# Patient Record
Sex: Male | Born: 1985
Health system: Western US, Academic
[De-identification: ages and names within clinical notes are randomized; demographics above are authoritative.]

---

## 2020-07-14 IMAGING — CT CT NECK SOFT TISSUE W CONTRAST
3 of 4 series · 15 of 33 positions shown, 18 images · IV contrast (isovue)
Comparison: None.

HISTORY: 33-year-old male with dysphagia.
TECHNIQUE: CT of the neck was obtained with intravenous contrast. Axial images were obtained with coronal and sagittal MPR reconstructions.

CONTRAST: 100 mL Isovue 300.

[Series 3: soft tissue · axial · 0.44mm/px · z∈[+721,+901]mm · 7 of 110 slices shown, 9 images]
[im 10/110  soft-tissue]
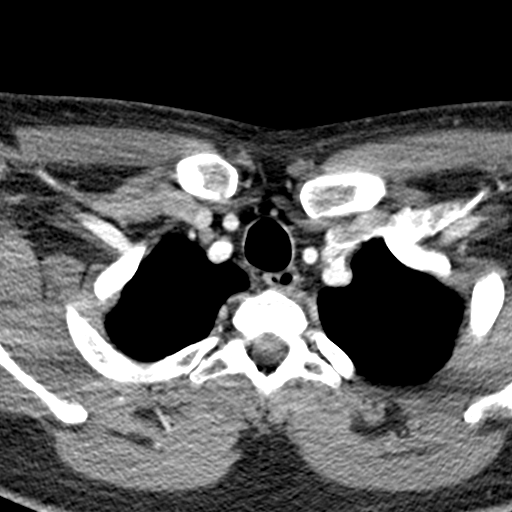
[im 10/110  bone]
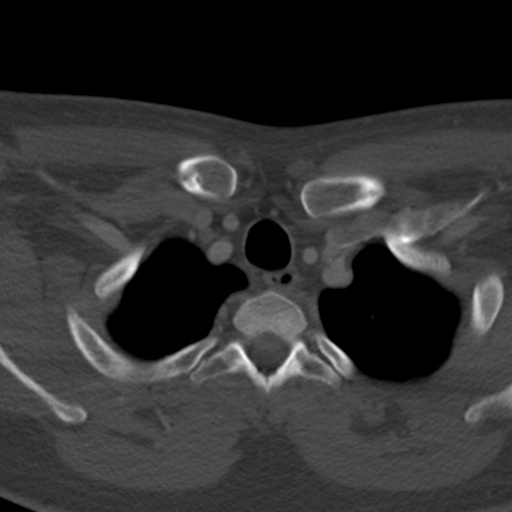
[im 30/110  bone]
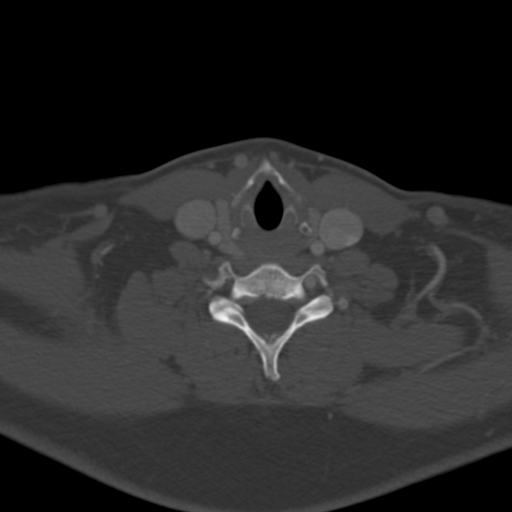
[im 40/110  bone]
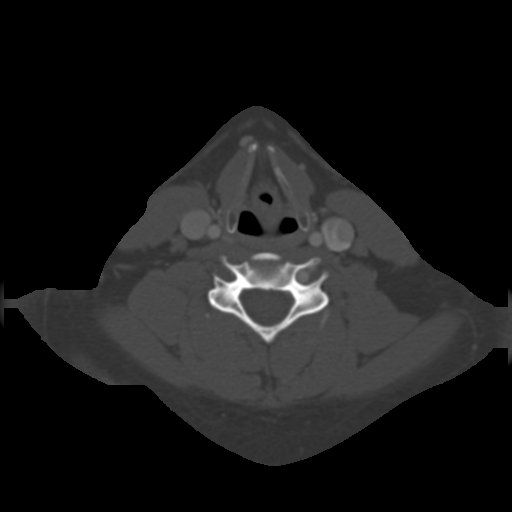
[im 60/110  bone]
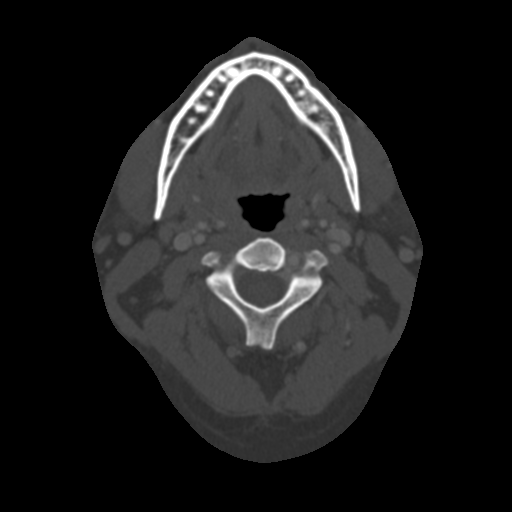
[im 70/110  soft-tissue]
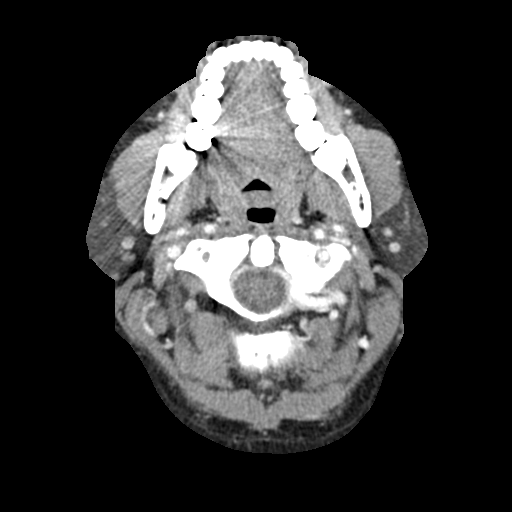
[im 70/110  bone]
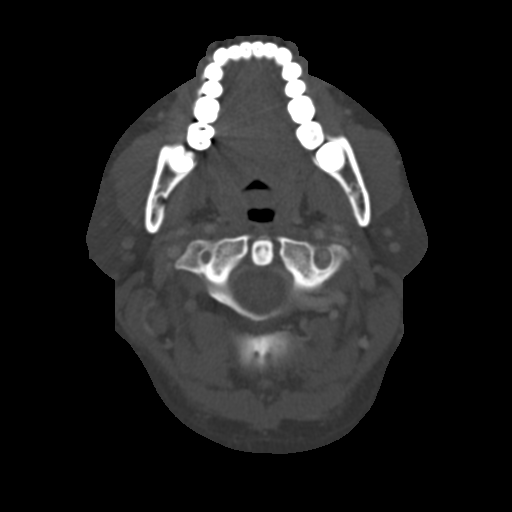
[im 80/110  bone]
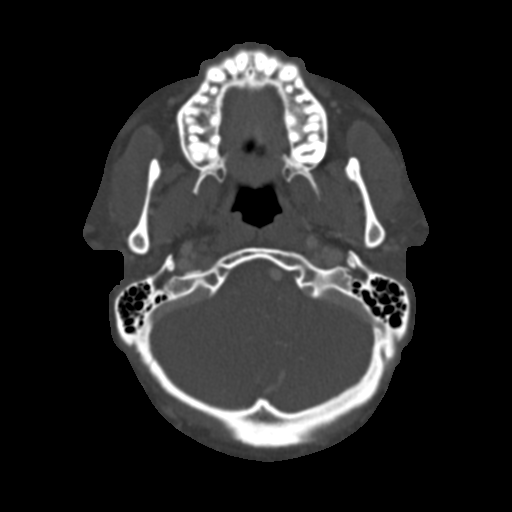
[im 100/110  bone]
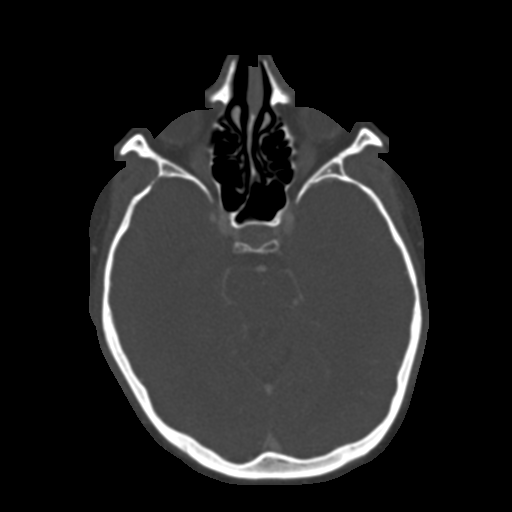

[Series 5: coronal · coronal · 0.39mm/px · 3 of 116 slices shown]
[im 24/116  bone]
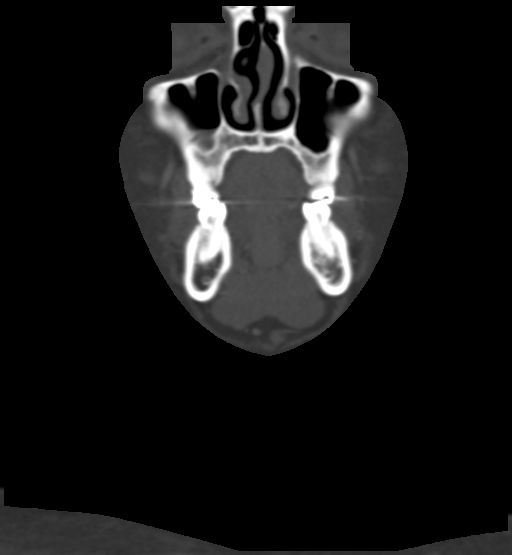
[im 47/116  bone]
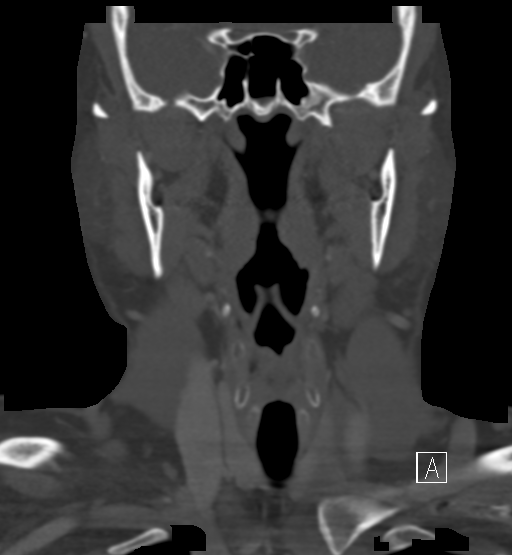
[im 70/116  bone]
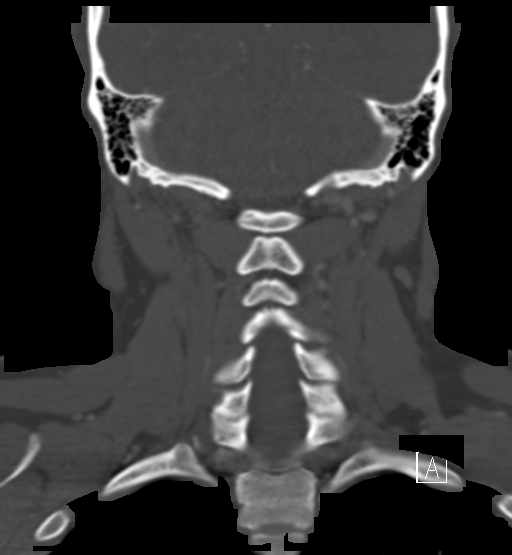

[Series 6: sagittal · sagittal · 0.43mm/px · 5 of 92 slices shown, 6 images]
[im 31/92  bone]
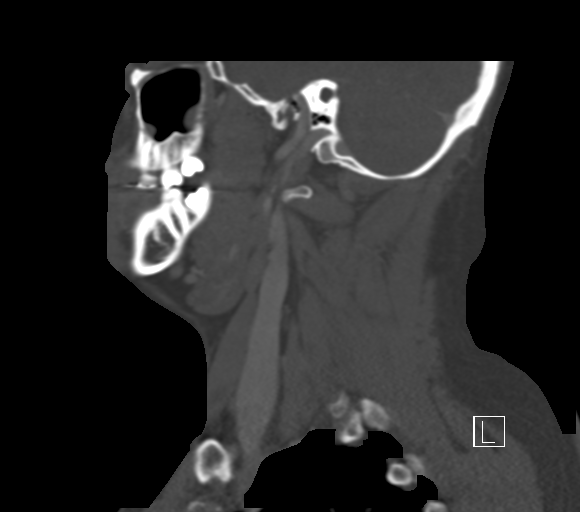
[im 38/92  bone]
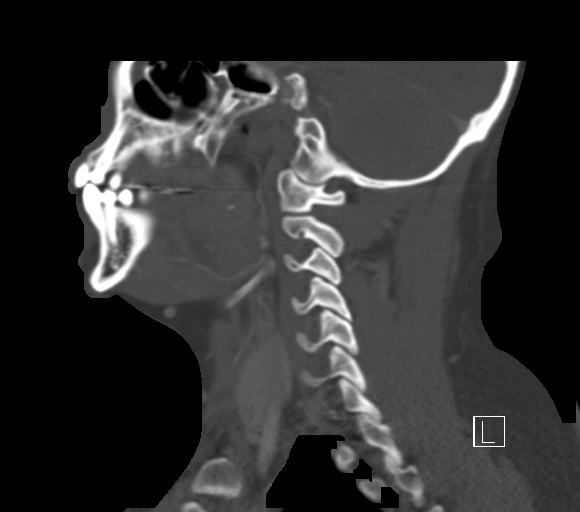
[im 46/92  soft-tissue]
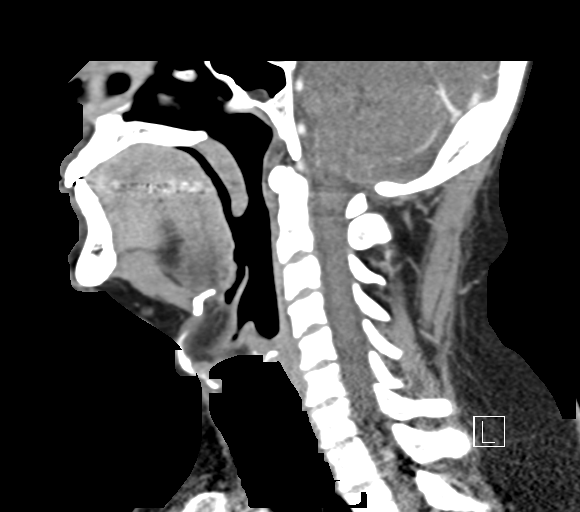
[im 46/92  bone]
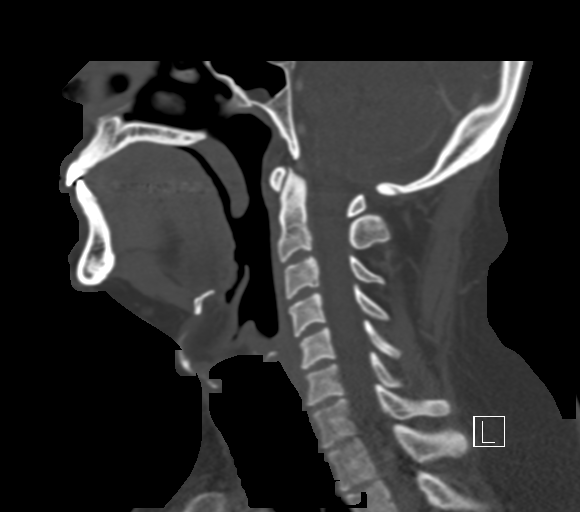
[im 54/92  bone]
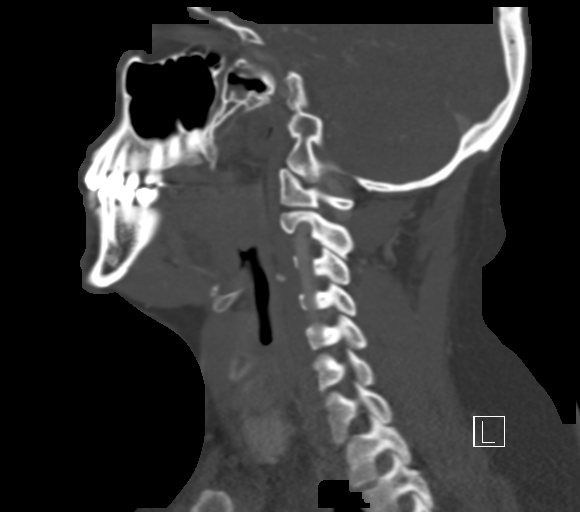
[im 61/92  bone]
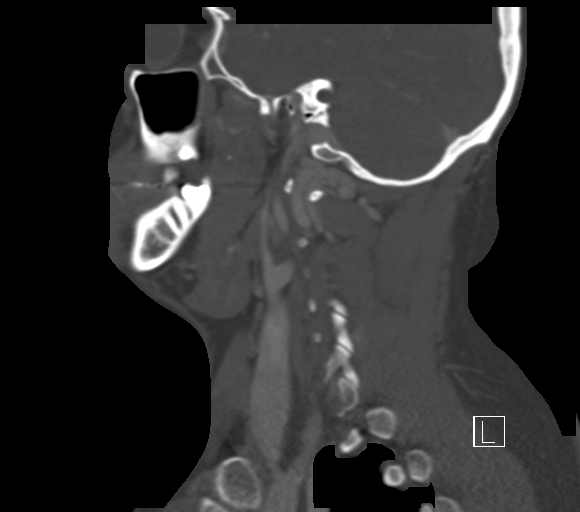

[15 of 33 positions shown; findings below may reference images not displayed]

FINDINGS: SOFT TISSUES: Please note that CT has low sensitivity to evaluate for mucosal lesions and visual inspection is recommended. Additionally, beam hardening artifact from dental amalgam limits evaluation of the surrounding structures and oral cavity.

The nasopharynx, oropharynx and palatine tonsils are normal in appearance. The bilateral parapharyngeal fat planes are maintained. The masticator, parotid, submandibular and submental spaces are normal in appearance. The laryngeal structures are normal in appearance with symmetric vocal cords. The thyroid is normal in appearance.

LYMPH NODES: No pathologic adenopathy.

VASCULATURE: Diminutive right vertebral artery. Otherwise, visualized vasculature is unremarkable.

OSSEOUS STRUCTURES: No acute or suspicious osseous abnormalities.

INTRATHORACIC STRUCTURES: No significant abnormality.

INTRACRANIAL STRUCTURES: No significant abnormality.
IMPRESSION: No abnormalities to explain patient's symptoms.

Total radiation dose to patient is CTDIvol 8.95 mGy and DLP 206.92 mGy-cm.

## 2020-08-10 ENCOUNTER — Inpatient Hospital Stay
Admit: 2020-08-10 | Discharge: 2020-08-10 | Disposition: A | Discharge status: Home / Self Care | Payer: PRIVATE HEALTH INSURANCE | Source: Home / Self Care

## 2020-08-10 ENCOUNTER — Telehealth: Payer: PRIVATE HEALTH INSURANCE

## 2020-08-10 DIAGNOSIS — Z1152 Encounter for screening for COVID-19: Secondary | ICD-10-CM

## 2020-08-10 DIAGNOSIS — J111 Influenza due to unidentified influenza virus with other respiratory manifestations: Secondary | ICD-10-CM

## 2020-08-10 LAB — COVID-19 PCR

## 2020-08-10 LAB — Influenza A B RSV PCR: INFLUENZA A PCR: NOT DETECTED

## 2020-08-10 NOTE — ED Provider Notes
[facility]  Emergency Department Service Report    Joe Mitchell 35 y.o. male , presents with ILI/PUI      Triage   Arrived on 08/10/2020 at 7:34 AM   Arrived by Walk-in [14]    ED Triage Vitals [08/10/20 0740]   Temp Temp Source BP Heart Rate Resp SpO2 O2 Device Pain Score Weight   37.1 ?C (98.8 ?F) Oral 126/78 79 18 99 % None (Room air) -- --       Pre hospital care:       No Known Allergies    History   Joe Mitchell is a 35 y.o. male who presents to the ED for evaluation of sore throat, cough, body aches, headache, fatigue that began last night.  Vaccinated for covid but had positive contact 4-5 day ago.  Symptoms moderate and constant.  Requesting a test and work note.  Denies medical history.                 Past Medical History:   Diagnosis Date   ? Childhood asthma         History reviewed. No pertinent surgical history.     Past Family History   Family history reviewed by me and there is no pertinent past family history related to the patient's current case and/or care.             Past Social History   he has no history on file for tobacco use, alcohol use, drug use, and sexual activity.     Review of Systems   Constitutional: Positive for fatigue. Negative for fever.   HENT: Positive for sore throat. Negative for congestion.    Eyes: Negative for visual disturbance.   Respiratory: Positive for cough. Negative for chest tightness and shortness of breath.    Cardiovascular: Negative for chest pain.   Gastrointestinal: Negative for nausea and vomiting.   Musculoskeletal: Positive for back pain and myalgias.   Skin: Negative for rash.   Neurological: Positive for headaches.       Physical Exam   Physical Exam  Vitals and nursing note reviewed.   Constitutional:       Appearance: He is well-developed.      Comments: Well nourished 35 y.o. male in no distress.     HENT:      Head: Normocephalic and atraumatic.      Nose: Nose normal.   Eyes:      Conjunctiva/sclera: Conjunctivae normal.   Cardiovascular:      Rate and Rhythm: Normal rate and regular rhythm.      Heart sounds: Normal heart sounds.   Pulmonary:      Effort: Pulmonary effort is normal. No respiratory distress.      Breath sounds: Normal breath sounds. No wheezing.   Abdominal:      General: There is no distension.      Palpations: Abdomen is soft.      Tenderness: There is no abdominal tenderness.   Musculoskeletal:         General: No tenderness. Normal range of motion.      Cervical back: Normal range of motion and neck supple.   Lymphadenopathy:      Cervical: No cervical adenopathy.   Skin:     General: Skin is warm and dry.      Findings: No rash.   Neurological:      General: No focal deficit present.      Mental  Status: He is alert and oriented to person, place, and time.      Cranial Nerves: No cranial nerve deficit.   Psychiatric:         Mood and Affect: Mood normal.         Behavior: Behavior normal.         ED Course          Laboratory Results   Labs Reviewed - No data to display    Imaging Results     No orders to display       Administered Medications     Medication Administration from 08/10/2020 0734 to 08/10/2020 0748     None          Procedures   Procedural Sedation  Procedures    MDM  Number of Diagnoses or Management Options    Joe Mitchell presented with ILI  ED Course and plan of care: 35 year old with ILI symptoms and exposure to covid.  No hypoxia or fever here, lungs clear.  covid test sent.  No medical history.  Advised to self-quarantine and self care.  No indication for monoclonal Ab or covid therapeutics.        Clinical Impression     1. Encounter for screening for COVID-19    2. Influenza-like syndrome        Prescriptions     New Prescriptions    No medications on file       Disposition and Follow-up   Disposition: Discharge [1]    No future appointments.    Follow up with:  Clinic, Lavina Hamman, MD  [address]  [place] [address]  [phone]    Schedule an appointment as soon as possible for a visit in 1 week        Return precautions are specified on After Visit Summary.                 Vernard Gambles A., MD  08/10/20 [phone]

## 2020-08-10 NOTE — Telephone Encounter
COVID Positive Results Documentation:   Date and Time: 2.16.22 108 pm  Result and follow-up instructions provided to patient. Reviewed After Visit Summary. Answered all questions.    Additional Resources:   [place] [employer] or [email or web address] of [facility] 24/7 [phone]  [facility] Infectious Disease Hotline [phone] or [email or web address]

## 2024-04-04 ENCOUNTER — Ambulatory Visit: Payer: BLUE CROSS/BLUE SHIELD
